# Patient Record
Sex: Female | Born: 2005 | Race: White | Hispanic: No | Marital: Single | Smoking: Never smoker
Health system: Southern US, Community
[De-identification: ages and names within clinical notes are randomized; demographics above are authoritative.]

## PROBLEM LIST (undated history)

## (undated) DIAGNOSIS — T7840XA Allergy, unspecified, initial encounter: Secondary | ICD-10-CM

## (undated) DIAGNOSIS — F419 Anxiety disorder, unspecified: Secondary | ICD-10-CM

## (undated) DIAGNOSIS — K219 Gastro-esophageal reflux disease without esophagitis: Secondary | ICD-10-CM

## (undated) HISTORY — PX: TONSILLECTOMY: SUR1361

## (undated) HISTORY — DX: Allergy, unspecified, initial encounter: T78.40XA

## (undated) HISTORY — DX: Anxiety disorder, unspecified: F41.9

## (undated) HISTORY — DX: Gastro-esophageal reflux disease without esophagitis: K21.9

---
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
## 2024-02-07 LAB — OB RESULTS CONSOLE GC/CHLAMYDIA: Chlamydia: NEGATIVE

## 2024-03-08 ENCOUNTER — Encounter: Payer: Self-pay | Admitting: Family Medicine

## 2024-03-08 ENCOUNTER — Ambulatory Visit (INDEPENDENT_AMBULATORY_CARE_PROVIDER_SITE_OTHER): Payer: Self-pay | Admitting: Family Medicine

## 2024-03-08 VITALS — BP 103/60 | HR 83 | Temp 97.9°F | Ht 65.0 in | Wt 161.0 lb

## 2024-03-08 DIAGNOSIS — Z0000 Encounter for general adult medical examination without abnormal findings: Secondary | ICD-10-CM

## 2024-03-08 DIAGNOSIS — F411 Generalized anxiety disorder: Secondary | ICD-10-CM | POA: Diagnosis not present

## 2024-03-08 MED ORDER — SERTRALINE HCL 50 MG PO TABS: 50.0000 mg | ORAL_TABLET | Freq: Every day | ORAL | 1 refills | Status: DC

## 2024-03-08 NOTE — Progress Notes (Signed)
 Complete physical exam   Patient: Victoria Pollard   DOB: 2006/03/04   18 y.o. Female  MRN: [id number] Visit Date: 03/08/2024  Today's healthcare provider: Jon Eva, MD   Chief Complaint  Patient presents with   Annual Exam    Diet: No fish at all, otherwise normal  Exercise: 4-5 days/week 1-1.5 h, walking and jogging  Sleep: Could be better, has problems falling asleep  Overall: Great Chlamydia screening- seeing OBGYN  Declines HPV at this time, still on the fence    Subjective    Victoria Pollard is a 18 y.o. female who presents today for a complete physical exam.    Discussed the use of AI scribe software for clinical note transcription with the patient, who gave verbal consent to proceed.  History of Present Illness   Victoria Pollard is a 18 year old female who presents for a routine physical exam during her pregnancy.  She is pregnant with her last menstrual period in March and is attending an anatomy ultrasound today. Morning sickness has subsided in the second trimester, but she carries crackers as a precaution. She is taking Zoloft  for anxiety, which is well-managed. She experienced swelling while in [place] , which resolved upon returning to a lower altitude. Her due date is Christmas Eve.        Last depression screening scores    03/08/2024   10:56 AM 12/08/2023    1:14 PM 10/27/2023    2:26 PM  PHQ 2/9 Scores  PHQ - 2 Score 0 0 2  PHQ- 9 Score 6 3 16    Last fall risk screening    03/08/2024   10:56 AM  Fall Risk   Falls in the past year? 0  Number falls in past yr: 0  Injury with Fall? 0  Risk for fall due to : No Fall Risks  Follow up Falls evaluation completed        Medications: Outpatient Medications Prior to Visit  Medication Sig   omeprazole  (PRILOSEC) 20 MG capsule Take 1 capsule (20 mg total) by mouth daily.   Prenatal Vit-Fe Fumarate-FA (PRENATAL MULTIVITAMIN) TABS tablet Take 1 tablet by mouth daily at 12 noon.    [DISCONTINUED] Ferrous Sulfate (IRON PO) Take 1 tablet by mouth daily.   [DISCONTINUED] sertraline  (ZOLOFT ) 50 MG tablet Take 1 tablet (50 mg total) by mouth daily.   No facility-administered medications prior to visit.    Review of Systems    Objective    BP 103/60 (BP Location: Left Arm, Patient Position: Sitting, Cuff Size: Normal)   Pulse 83   Temp 97.9 F (36.6 C) (Oral)   Ht 5' 5 (1.651 m)   Wt 161 lb (73 kg)   LMP 11/02/2023 (Exact Date)   SpO2 100%   BMI 26.79 kg/m    Physical Exam Vitals reviewed.  Constitutional:      General: She is not in acute distress.    Appearance: Normal appearance. She is well-developed. She is not diaphoretic.  HENT:     Head: Normocephalic and atraumatic.     Right Ear: Tympanic membrane, ear canal and external ear normal.     Left Ear: Tympanic membrane, ear canal and external ear normal.     Nose: Nose normal.     Mouth/Throat:     Mouth: Mucous membranes are moist.     Pharynx: Oropharynx is clear. No oropharyngeal exudate.  Eyes:     General: No scleral icterus.  Conjunctiva/sclera: Conjunctivae normal.     Pupils: Pupils are equal, round, and reactive to light.  Neck:     Thyroid: No thyromegaly.  Cardiovascular:     Rate and Rhythm: Normal rate and regular rhythm.     Heart sounds: Normal heart sounds. No murmur heard. Pulmonary:     Effort: Pulmonary effort is normal. No respiratory distress.     Breath sounds: Normal breath sounds. No wheezing or rales.  Abdominal:     General: There is no distension.     Tenderness: There is no abdominal tenderness.     Comments: gravid  Musculoskeletal:        General: No deformity.     Cervical back: Neck supple.     Right lower leg: No edema.     Left lower leg: No edema.  Lymphadenopathy:     Cervical: No cervical adenopathy.  Skin:    General: Skin is warm and dry.     Findings: No rash.  Neurological:     Mental Status: She is alert and oriented to person, place, and  time. Mental status is at baseline.     Gait: Gait normal.  Psychiatric:        Mood and Affect: Mood normal.        Behavior: Behavior normal.        Thought Content: Thought content normal.      Results for orders placed or performed in visit on 03/08/24  [facility] HIV SCREENING LAB  Result Value Ref Range   [facility] HIV Screening Negative - Validated   [facility] HEPATITIS C SCREENING LAB  Result Value Ref Range   [facility] Hepatitis Screen Negative-Validated   Results for orders placed or performed in visit on 03/08/24  OB RESULTS CONSOLE GC/Chlamydia  Result Value Ref Range   Chlamydia Negative     Assessment & Plan    Routine Health Maintenance and Physical Exam  Exercise Activities and Dietary recommendations  Goals   None     Immunization History  Administered Date(s) Administered   DTaP 11/06/2004, 01/08/2005, 03/12/2005, 12/03/2005, 09/13/2008   HIB (PRP-OMP) 11/06/2004, 01/08/2005, 12/03/2005   Hepatitis A 12/03/2005, 09/09/2006   Hepatitis B 11/12/04, 10/09/2004, 06/11/2005   IPV 11/06/2004, 01/08/2005, 06/11/2005, 09/03/2008   Influenza,inj,Quad PF,6+ Mos 09/27/2019, 09/11/2021   MMR 09/03/2005, 09/13/2008   Meningococcal B, OMV 09/11/2021, 11/16/2021   Meningococcal Mcv4o 03/25/2017, 09/11/2021   Pneumococcal Conjugate PCV 7 11/06/2004, 01/08/2005, 03/12/2005, 09/03/2005   Tdap 03/25/2017   Varicella 09/03/2005, 09/13/2008    Health Maintenance  Topic Date Due   COVID-19 Vaccine (1 - 2024-25 season) 03/24/2024 (Originally 04/17/2023)   HPV VACCINES (1 - 3-dose series) 03/08/2025 (Originally 09/01/2019)   INFLUENZA VACCINE  03/16/2024   CHLAMYDIA SCREENING  02/06/2025   DTaP/Tdap/Td (7 - Td or Tdap) 03/26/2027   Hepatitis B Vaccines  Completed   Hepatitis C Screening  Completed   HIV Screening  Completed   Meningococcal B Vaccine  Completed    Discussed health benefits of physical activity, and encouraged her to engage in regular exercise appropriate for her age and  condition.  Problem List Items Addressed This Visit       Other   GAD (generalized anxiety disorder)   Relevant Medications   sertraline  (ZOLOFT ) 50 MG tablet   Other Visit Diagnoses       Encounter for annual physical exam    -  Primary           Pregnancy She is in  her second trimester and receiving prenatal care from her OB. Morning sickness has subsided, and she manages well with occasional use of crackers. - Continue prenatal care with OB. - Attend anatomy ultrasound as scheduled.  Anxiety disorder Anxiety disorder is well-controlled with Zoloft , which is effective and safe to continue throughout pregnancy and breastfeeding. - Continue Zoloft  as prescribed. - Send refill of Zoloft  to [facility].  General Health Maintenance She is up to date on vaccinations. It is recommended to receive a flu shot during pregnancy due to increased risk. A tetanus shot will be administered in the third trimester for the benefit of the baby and to update her vaccination status. - Recommend flu shot in September or October. - Administer tetanus shot in the third trimester.        Return in about 1 year (around 03/08/2025) for CPE.     Jon Eva, MD  [facility] [phone] (phone) [phone] (fax)  [facility]

## 2024-08-29 ENCOUNTER — Encounter: Payer: Self-pay | Admitting: Family Medicine

## 2024-08-29 NOTE — Telephone Encounter (Signed)
 Noted

## 2024-09-10 ENCOUNTER — Ambulatory Visit: Admitting: Family Medicine

## 2024-09-11 ENCOUNTER — Ambulatory Visit: Admitting: Family Medicine

## 2024-09-20 ENCOUNTER — Encounter: Payer: Self-pay | Admitting: Family Medicine

## 2024-09-20 ENCOUNTER — Ambulatory Visit: Admitting: Family Medicine

## 2024-09-20 VITALS — BP 85/72 | HR 80 | Resp 14 | Ht 66.0 in | Wt 150.1 lb

## 2024-09-20 DIAGNOSIS — M5441 Lumbago with sciatica, right side: Secondary | ICD-10-CM

## 2024-09-20 DIAGNOSIS — F411 Generalized anxiety disorder: Secondary | ICD-10-CM

## 2024-09-20 MED ORDER — PREDNISONE 10 MG PO TABS: ORAL_TABLET | ORAL | 0 refills | Status: AC

## 2024-09-20 NOTE — Progress Notes (Signed)
 "     Established patient visit   Patient: Victoria Pollard   DOB: 01/11/2006   19 y.o. Female  MRN: [id number] Visit Date: 09/20/2024  Today's healthcare provider: Jon Eva, MD   Chief Complaint  Patient presents with   Medical Management of Chronic Issues    6 month follow up   Back Pain    Since giving birth December 20th has developed lower back pain radiates left leg pain. Otc: tylenol, ibuprofen   Subjective    Back Pain   HPI     Medical Management of Chronic Issues    Additional comments: 6 month follow up        Back Pain    Additional comments: Since giving birth December 20th has developed lower back pain radiates left leg pain. Otc: tylenol, ibuprofen      Last edited by Wilfred Hargis RAMAN, CMA on 09/20/2024 10:20 AM.       Discussed the use of AI scribe software for clinical note transcription with the patient, who gave verbal consent to proceed.  History of Present Illness   Victoria Pollard is a 19 year old female who presents with back and leg pain postpartum.  She has had persistent back pain since giving birth. The pain is provoked by sitting and standing and radiates down her leg to the calf. She also has hip pain that worsens with pressure on the leg. She develops numbness in the leg when lying down but not when standing. She denies bowel or bladder changes. She has taken prednisone  in the past for this pain. She has weaned off Zoloft  and has had no anxiety symptoms for the past three to four months.          03/08/2024   10:56 AM 12/08/2023    1:14 PM 10/27/2023    2:26 PM 09/04/2019   12:05 PM  GAD 7 : Generalized Anxiety Score  Nervous, Anxious, on Edge 1  1  3   0   Control/stop worrying 0  0  3  0   Worry too much - different things 1  0  3  3   Trouble relaxing 1  0  2  0   Restless 1  0  2  3   Easily annoyed or irritable 2  0  3  3   Afraid - awful might happen 0  3  3  2    Total GAD 7 Score 6 4 19 11   Anxiety Difficulty Not  difficult at all Not difficult at all Extremely difficult Somewhat difficult     Data saved with a previous flowsheet row definition        09/20/2024   10:19 AM 03/08/2024   10:56 AM 12/08/2023    1:14 PM 10/27/2023    2:26 PM 09/11/2021    8:28 AM  Depression screen PHQ 2/9  Decreased Interest 0 0 0 1 0  Down, Depressed, Hopeless 0 0 0 1 1  PHQ - 2 Score 0 0 0 2 1  Altered sleeping 0 3 1 3 3   Tired, decreased energy 0 2 1 3 3   Change in appetite 0 1 1 3 3   Feeling bad or failure about yourself  0 0 0 0 2  Trouble concentrating 0 0 0 3 2  Moving slowly or fidgety/restless 1 0 0 2 3  Suicidal thoughts 0 0 0 0 0  PHQ-9 Score 1 6  3  16   17  Difficult doing work/chores Somewhat difficult Not difficult at all Not difficult at all Very difficult Extremely dIfficult     Data saved with a previous flowsheet row definition     Medications: Show/hide medication list[1]  Review of Systems  Musculoskeletal:  Positive for back pain.       Objective    BP (!) 85/72   Pulse 80   Resp 14   Ht 5' 6 (1.676 m)   Wt 150 lb 1.6 oz (68.1 kg)   LMP 11/02/2023 (Exact Date)   SpO2 98%   Breastfeeding Yes   BMI 24.23 kg/m    Physical Exam Vitals reviewed.  Constitutional:      General: She is not in acute distress.    Appearance: Normal appearance. She is well-developed. She is not diaphoretic.  HENT:     Head: Normocephalic and atraumatic.  Eyes:     General: No scleral icterus.    Conjunctiva/sclera: Conjunctivae normal.  Neck:     Thyroid: No thyromegaly.  Cardiovascular:     Rate and Rhythm: Normal rate and regular rhythm.     Heart sounds: Normal heart sounds. No murmur heard. Pulmonary:     Effort: Pulmonary effort is normal. No respiratory distress.     Breath sounds: Normal breath sounds. No wheezing, rhonchi or rales.  Musculoskeletal:     Cervical back: Neck supple.     Right lower leg: No edema.     Left lower leg: No edema.     Comments: Back: No midline  TTP, ROM grossly intact.  Mild TTP over R lower back/SI joint. Negative SLR bilaterally.  Strength and sensation to light touch intact in lower extremities.    Lymphadenopathy:     Cervical: No cervical adenopathy.  Skin:    General: Skin is warm and dry.     Findings: No rash.  Neurological:     Mental Status: She is alert and oriented to person, place, and time. Mental status is at baseline.  Psychiatric:        Mood and Affect: Mood normal.        Behavior: Behavior normal.      No results found for any visits on 09/20/24.  Assessment & Plan     Problem List Items Addressed This Visit       Other   GAD (generalized anxiety disorder) - Primary   Well controlled Weaned herself off of zoloft  Continue to monitor off of medications      Other Visit Diagnoses       Acute right-sided low back pain with right-sided sciatica       Relevant Medications   predniSONE  (DELTASONE ) 10 MG tablet   Other Relevant Orders   Ambulatory referral to Physical Therapy     Postpartum care following vaginal delivery       Relevant Orders   Ambulatory referral to Physical Therapy          Acute right-sided low back pain with right-sided sciatica Acute right-sided low back pain with radiation down the right leg to the calf, consistent with sciatica. Symptoms include numbness when lying down and hip pain with pressure. No bowel or bladder changes reported. Likely due to postpartum changes and mechanical issues. - Prescribed a 6-day prednisone  taper starting at 60 mg and decreasing by 10 mg each day. - Referred to physical therapy focusing on pelvic floor and sciatica management. - Advised use of acetaminophen for pain management instead of ibuprofen or naproxen to avoid reducing  milk supply. - Recommended use of a heating pad or ice for symptomatic relief. - Suggested hot showers and YouTube sciatica exercises as interim measures before physical therapy.  Postpartum care following vaginal  delivery Postpartum care following recent vaginal delivery. - Scheduled a physical exam in six months.       Return in about 6 months (around 03/20/2025) for CPE.       Jon Eva, MD  [facility] [phone] (phone) [phone] (fax)  [facility]     [1]  Outpatient Medications Prior to Visit  Medication Sig   omeprazole  (PRILOSEC) 20 MG capsule Take 1 capsule (20 mg total) by mouth daily.   Prenatal Vit-Fe Fumarate-FA (PRENATAL MULTIVITAMIN) TABS tablet Take 1 tablet by mouth daily at 12 noon.   [DISCONTINUED] sertraline  (ZOLOFT ) 50 MG tablet Take 1 tablet (50 mg total) by mouth daily.   No facility-administered medications prior to visit.   "

## 2024-09-20 NOTE — Assessment & Plan Note (Signed)
 Well controlled Weaned herself off of zoloft  Continue to monitor off of medications

## 2024-10-11 ENCOUNTER — Ambulatory Visit: Admitting: Family Medicine

## 2025-03-21 ENCOUNTER — Encounter: Admitting: Family Medicine
